# Patient Record
Sex: Male | Born: 1990 | Race: White | Hispanic: No | Marital: Single | Smoking: Current every day smoker
Health system: Southern US, Community
[De-identification: ages and names within clinical notes are randomized; demographics above are authoritative.]

## PROBLEM LIST (undated history)

## (undated) ENCOUNTER — Ambulatory Visit

## (undated) ENCOUNTER — Encounter

## (undated) ENCOUNTER — Emergency Department (HOSPITAL_COMMUNITY): Payer: Self-pay

## (undated) DIAGNOSIS — F1990 Other psychoactive substance use, unspecified, uncomplicated: Secondary | ICD-10-CM

---

## 2012-11-03 ENCOUNTER — Encounter: Payer: Self-pay | Admitting: *Deleted

## 2012-11-03 ENCOUNTER — Emergency Department (INDEPENDENT_AMBULATORY_CARE_PROVIDER_SITE_OTHER): Payer: Self-pay

## 2012-11-03 ENCOUNTER — Emergency Department
Admission: EM | Admit: 2012-11-03 | Discharge: 2012-11-03 | Disposition: A | Discharge status: Home / Self Care | Payer: Self-pay | Source: Home / Self Care | Attending: Emergency Medicine | Admitting: Emergency Medicine

## 2012-11-03 DIAGNOSIS — S2231XA Fracture of one rib, right side, initial encounter for closed fracture: Secondary | ICD-10-CM

## 2012-11-03 DIAGNOSIS — S2239XA Fracture of one rib, unspecified side, initial encounter for closed fracture: Secondary | ICD-10-CM

## 2012-11-03 DIAGNOSIS — IMO0002 Reserved for concepts with insufficient information to code with codable children: Secondary | ICD-10-CM

## 2012-11-03 MED ORDER — HYDROCODONE-ACETAMINOPHEN 5-500 MG PO TABS: ORAL_TABLET | ORAL | Status: AC

## 2012-11-03 MED ORDER — ETODOLAC 500 MG PO TABS: 500.0000 mg | ORAL_TABLET | Freq: Two times a day (BID) | ORAL | Status: AC
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
## 2015-12-24 ENCOUNTER — Emergency Department (HOSPITAL_COMMUNITY): Payer: Self-pay

## 2015-12-24 ENCOUNTER — Emergency Department (HOSPITAL_COMMUNITY)
Admission: EM | Admit: 2015-12-24 | Discharge: 2015-12-25 | Disposition: A | Discharge status: Home / Self Care | Payer: Self-pay | Attending: Emergency Medicine | Admitting: Emergency Medicine

## 2015-12-24 ENCOUNTER — Encounter (HOSPITAL_COMMUNITY): Payer: Self-pay

## 2015-12-24 DIAGNOSIS — S0990XA Unspecified injury of head, initial encounter: Secondary | ICD-10-CM | POA: Insufficient documentation

## 2015-12-24 DIAGNOSIS — Z880 Allergy status to penicillin: Secondary | ICD-10-CM | POA: Insufficient documentation

## 2015-12-24 DIAGNOSIS — Y9389 Activity, other specified: Secondary | ICD-10-CM | POA: Insufficient documentation

## 2015-12-24 DIAGNOSIS — Y998 Other external cause status: Secondary | ICD-10-CM | POA: Insufficient documentation

## 2015-12-24 DIAGNOSIS — S20211A Contusion of right front wall of thorax, initial encounter: Secondary | ICD-10-CM | POA: Insufficient documentation

## 2015-12-24 DIAGNOSIS — Z23 Encounter for immunization: Secondary | ICD-10-CM | POA: Insufficient documentation

## 2015-12-24 DIAGNOSIS — S01511A Laceration without foreign body of lip, initial encounter: Secondary | ICD-10-CM | POA: Insufficient documentation

## 2015-12-24 DIAGNOSIS — S0512XA Contusion of eyeball and orbital tissues, left eye, initial encounter: Secondary | ICD-10-CM

## 2015-12-24 DIAGNOSIS — Y92410 Unspecified street and highway as the place of occurrence of the external cause: Secondary | ICD-10-CM | POA: Insufficient documentation

## 2015-12-24 DIAGNOSIS — F17200 Nicotine dependence, unspecified, uncomplicated: Secondary | ICD-10-CM | POA: Insufficient documentation

## 2015-12-24 HISTORY — DX: Other psychoactive substance use, unspecified, uncomplicated: F19.90

## 2015-12-24 LAB — COMPREHENSIVE METABOLIC PANEL
ALK PHOS: 69 U/L (ref 38–126)
ALT: 18 U/L (ref 17–63)
ANION GAP: 10 (ref 5–15)
AST: 29 U/L (ref 15–41)
Albumin: 5.1 g/dL — ABNORMAL HIGH (ref 3.5–5.0)
BUN: 11 mg/dL (ref 6–20)
CALCIUM: 9.7 mg/dL (ref 8.9–10.3)
CO2: 24 mmol/L (ref 22–32)
CREATININE: 0.94 mg/dL (ref 0.61–1.24)
Chloride: 105 mmol/L (ref 101–111)
Glucose, Bld: 108 mg/dL — ABNORMAL HIGH (ref 65–99)
Potassium: 4.3 mmol/L (ref 3.5–5.1)
SODIUM: 139 mmol/L (ref 135–145)
TOTAL PROTEIN: 7.7 g/dL (ref 6.5–8.1)
Total Bilirubin: 0.9 mg/dL (ref 0.3–1.2)

## 2015-12-24 LAB — CBC WITH DIFFERENTIAL/PLATELET
BASOS ABS: 0 10*3/uL (ref 0.0–0.1)
Basophils Relative: 0 %
EOS ABS: 0.1 10*3/uL (ref 0.0–0.7)
EOS PCT: 0 %
HCT: 47 % (ref 39.0–52.0)
Hemoglobin: 16.6 g/dL (ref 13.0–17.0)
Lymphocytes Relative: 6 %
Lymphs Abs: 1.1 10*3/uL (ref 0.7–4.0)
MCH: 32.7 pg (ref 26.0–34.0)
MCHC: 35.3 g/dL (ref 30.0–36.0)
MCV: 92.5 fL (ref 78.0–100.0)
Monocytes Absolute: 1.2 10*3/uL — ABNORMAL HIGH (ref 0.1–1.0)
Monocytes Relative: 6 %
Neutro Abs: 16.4 10*3/uL — ABNORMAL HIGH (ref 1.7–7.7)
Neutrophils Relative %: 88 %
Platelets: 245 10*3/uL (ref 150–400)
RBC: 5.08 MIL/uL (ref 4.22–5.81)
RDW: 12.4 % (ref 11.5–15.5)
WBC: 18.7 10*3/uL — AB (ref 4.0–10.5)

## 2015-12-24 LAB — ETHANOL

## 2015-12-24 MED ORDER — ONDANSETRON HCL 4 MG/2ML IJ SOLN
4.0000 mg | Freq: Once | INTRAMUSCULAR | Status: AC
  Administered 2015-12-24: 4 mg via INTRAVENOUS
  Filled 2015-12-24: qty 2

## 2015-12-24 MED ORDER — TETANUS-DIPHTH-ACELL PERTUSSIS 5-2.5-18.5 LF-MCG/0.5 IM SUSP
0.5000 mL | Freq: Once | INTRAMUSCULAR | Status: AC
  Administered 2015-12-24: 0.5 mL via INTRAMUSCULAR
  Filled 2015-12-24: qty 0.5

## 2015-12-24 MED ORDER — BUPIVACAINE-EPINEPHRINE (PF) 0.5% -1:200000 IJ SOLN
3.6000 mL | Freq: Once | INTRAMUSCULAR | Status: AC
  Administered 2015-12-24: 3.6 mL
  Filled 2015-12-24: qty 3.6

## 2015-12-24 MED ORDER — LIDOCAINE HCL (PF) 1 % IJ SOLN
30.0000 mL | Freq: Once | INTRAMUSCULAR | Status: AC
  Administered 2015-12-24: 30 mL via INTRADERMAL
  Filled 2015-12-24: qty 30

## 2015-12-24 MED ORDER — MORPHINE SULFATE (PF) 4 MG/ML IV SOLN
4.0000 mg | Freq: Once | INTRAVENOUS | Status: AC
  Administered 2015-12-24: 4 mg via INTRAVENOUS
  Filled 2015-12-24: qty 1
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
## 2015-12-25 MED ORDER — NAPROXEN 500 MG PO TABS: 500.0000 mg | ORAL_TABLET | Freq: Two times a day (BID) | ORAL | Status: AC

## 2015-12-25 NOTE — ED Notes (Signed)
Pt stable, ambulatory, states understanding of discharge instructions 
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
## 2016-01-01 ENCOUNTER — Emergency Department (HOSPITAL_COMMUNITY)
Admission: EM | Admit: 2016-01-01 | Discharge: 2016-01-01 | Disposition: A | Discharge status: Home / Self Care | Payer: Self-pay | Attending: Emergency Medicine | Admitting: Emergency Medicine

## 2016-01-01 ENCOUNTER — Encounter (HOSPITAL_COMMUNITY): Payer: Self-pay | Admitting: Emergency Medicine

## 2016-01-01 DIAGNOSIS — Z791 Long term (current) use of non-steroidal anti-inflammatories (NSAID): Secondary | ICD-10-CM | POA: Insufficient documentation

## 2016-01-01 DIAGNOSIS — F17200 Nicotine dependence, unspecified, uncomplicated: Secondary | ICD-10-CM | POA: Insufficient documentation

## 2016-01-01 DIAGNOSIS — Z4802 Encounter for removal of sutures: Secondary | ICD-10-CM | POA: Insufficient documentation

## 2016-01-01 DIAGNOSIS — Z880 Allergy status to penicillin: Secondary | ICD-10-CM | POA: Insufficient documentation

## 2016-01-01 MED ORDER — IBUPROFEN 400 MG PO TABS
800.0000 mg | ORAL_TABLET | Freq: Once | ORAL | Status: AC
  Administered 2016-01-01: 800 mg via ORAL
  Filled 2016-01-01: qty 2

## 2016-01-01 NOTE — ED Provider Notes (Signed)
CSN: [id number]     Arrival date & time 01/01/16  1308 History   None    Chief Complaint  Patient presents with  . Suture / Staple Removal     (Consider location/radiation/quality/duration/timing/severity/associated sxs/prior Treatment) Patient is a 25 y.o. male presenting with suture removal. The history is provided by the patient.  Suture / Staple Removal This is a new problem.   Estrella DeedsJacob Matthews is a 25 y.o. male who presents to the ED for suture removal from his lower lip. He denies any problems since they were placed 12/24/15.   Past Medical History  Diagnosis Date  . IV drug user    History reviewed. No pertinent past surgical history. History reviewed. No pertinent family history. Social History  Substance Use Topics  . Smoking status: Current Every Day Smoker  . Smokeless tobacco: None  . Alcohol Use: Yes    Review of Systems Negative except as stated in HPI   Allergies  Penicillins  Home Medications   Prior to Admission medications   Medication Sig Start Date End Date Taking? Authorizing Provider  etodolac (LODINE) 500 MG tablet Take 1 tablet (500 mg total) by mouth 2 (two) times daily with a meal. As needed for pain. 11/03/12   Lajean Manesavid Massey, MD  HYDROcodone-acetaminophen (VICODIN) 5-500 MG per tablet Take 1 or 2 every 4-6 hours as needed for severe pain 11/03/12   Lajean Manesavid Massey, MD  naproxen (NAPROSYN) 500 MG tablet Take 1 tablet (500 mg total) by mouth 2 (two) times daily. 12/25/15   Antony MaduraKelly Humes, PA-C   BP 120/62 mmHg  Pulse 63  Temp(Src) 98.1 F (36.7 C) (Oral)  Ht 5\' 9"  (1.753 m)  Wt 79.379 kg  BMI 25.83 kg/m2  SpO2 100% Physical Exam  Constitutional: He is oriented to person, place, and time. He appears well-developed and well-nourished. No distress.  HENT:  Head: Normocephalic.  Sutures in place lower lip outside and inside, healing well without signs of infection.  Eyes: Conjunctivae and EOM are normal.  Neck: Normal range of motion. Neck supple.   Cardiovascular: Normal rate.   Pulmonary/Chest: Effort normal.  Musculoskeletal: Normal range of motion.  Neurological: He is alert and oriented to person, place, and time. No cranial nerve deficit.  Skin: Skin is warm and dry.  Psychiatric: He has a normal mood and affect. His behavior is normal.  Nursing note and vitals reviewed.   ED Course  Procedures Sutures removal by me. Sutures removed from lower lip x 6 sutures, patient tolerated well without any immediate problems.  MDM  25 y.o. male here for suture removal stable for d/c without signs of infection.  Discussed with the patient plan of care and all questioned fully answered. He will return if any problems arise.  Final diagnoses:  Visit for suture removal       Janne NapoleonHope M Neese, NP 01/01/16 1500  Bethann BerkshireJoseph Zammit, MD 01/01/16 1609

## 2016-01-01 NOTE — ED Notes (Signed)
Pt ambulates independently at time of discharge. Follow up information and discharge instructions reviewed with patient. No additional questions at this time.

## 2016-01-01 NOTE — ED Notes (Signed)
Pt here for removal of stitched that were placed here Friday the 6th.

## 2016-01-01 NOTE — Discharge Instructions (Signed)

## 2016-01-01 NOTE — ED Notes (Signed)
See PA assessment 
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
## 2022-10-14 MED ORDER — DUPIXENT 300 MG/2 ML SUBCUTANEOUS SYRINGE
SUBCUTANEOUS | 12 refills | 28 days
Start: 2022-10-14 — End: ?

## 2022-10-14 NOTE — Unmapped (Signed)
[facility] Specialty Medication Onboarding    Specialty Medication: DUPIXENT SYRINGE 300 mg/2 mL Syrg injection (dupilumab)  Prior Authorization: Not Required   Financial Assistance: Yes - copay card approved as secondary   Final Copay/Day Supply: $0 / 28    Insurance Restrictions: Yes - max 1 month supply     Notes to Pharmacist: n/a    The triage team has completed the benefits investigation and has determined that the patient is able to fill this medication at [facility]. Please contact the patient to complete the onboarding or follow up with the prescribing physician as needed.

## 2022-10-16 NOTE — Unmapped (Signed)
[facility]   Patient Onboarding/Medication Counseling    Johnny Valdez is a 31 y.o. male with atopic eczema who I am counseling today on continuation of therapy.  I am speaking to the patient's family member, wife, [name]  .    Was a Nurse, learning disability used for this call? No    Verified patient's date of birth / HIPAA.    Specialty medication(s) to b2e sent: Inflammatory Disorders: Dupixent      Non-specialty medications/supplies to be sent: sharps kit      Medications not needed at this time: na         The patient declined counseling on medication administration, missed dose instructions, goals of therapy, side effects and monitoring parameters, warnings and precautions, drug/food interactions, and storage, handling precautions, and disposal because they have taken the medication previously. The information in the declined sections below are for informational purposes only and was not discussed with patient.       Dupixent (dupilumab)    Medication & Administration     Dosage: Atopic dermatitis: Inject 600mg  under the skin as a loading dose followed by 300mg  every 14 days thereafter (maintenance only)    Administration:     Dupixent Syringe  1. Gather all supplies needed for injection on a clean, flat working surface: medication syringe removed from packaging, alcohol swab, sharps container, etc.  2. Look at the medication label - look for correct medication, correct dose, and check the expiration date  3. Look at the medication - the liquid in the syringe should appear clear and colorless to pale yellow  4. Lay the syringe on a flat surface and allow it to warm up to room temperature for at least 45 minutes  5. Select injection site - you can use the front of your thigh or your belly (but not the area 2 inches around your belly button); if someone else is giving you the injection you can also use your upper arm in the skin covering your triceps muscle  6. Prepare injection site - wash your hands and clean the skin at the injection site with an alcohol swab and let it air dry, do not touch the injection site again before the injection  7. Hold the middle of the body of the syringe and gently pull the needle safety cap straight out. Be careful not to bend the needle. Do not remove until immediately prior to injection  8. Pinch the skin - with your hand not holding the syringe pinch up a fold of skin at the injection site using your forefinger and thumb  9. Insert the needle into the fold of skin at about a 45 degree angle - it's best to use a quick dart-like motion - with the syringe in position, release the pinch of skin and allow the skin to relax  10. Push the plunger down slowly as far as it will go until the syringe is empty, if the plunger is not fully depressed the needle shield will not extend to cover the needle when it is removed  11. Check that the syringe is empty and keep pressing down on the plunger while you pull the needle out at the same angle as inserted; after the needle is removed completely from the skin, release the plunger allowing the needle shield to activate and cover the used needle  12. Dispose of the used syringe immediately in your sharps disposal container  13. If you see any blood at the injection site,  press a cotton ball or gauze on the site and maintain pressure until the bleeding stops, do not rub the injection site    Adherence/Missed dose instructions:  If a dose is missed, administer within 7 days from the missed dose and then resume the original schedule. If the missed dose is not administered within 7 days, you can either wait until the next dose on the original schedule or take your dose now and resume every 14 days from the new injection date. Do not use 2 doses at the same time or extra doses.      Goals of Therapy     -Reduce symptoms of pruritus and dermatitis  -Prevent exacerbations  -Minimize therapeutic risks  -Avoidance of long-term systemic and topical glucocorticoid use  -Maintenance of effective psychosocial functioning    Side Effects & Monitoring Parameters     Injection site reaction (redness, irritation, inflammation localized to the site of administration)  Signs of a common cold - minor sore throat, runny or stuffy nose, etc.  Recurrence of cold sores (herpes simplex)      The following side effects should be reported to the provider:  Signs of a hypersensitivity reaction - rash; hives; itching; red, swollen, blistered, or peeling skin; wheezing; tightness in the chest or throat; difficulty breathing, swallowing, or talking; swelling of the mouth, face, lips, tongue, or throat; etc.  Eye pain or irritation or any visual disturbances  Shortness of breath or worsening of breathing      Contraindications, Warnings, & Precautions     Have your bloodwork checked as you have been told by your prescriber   Birth control pills and other hormone-based birth control may not work as well to prevent pregnancy  Talk with your doctor if you are pregnant, planning to become pregnant, or breastfeeding  Discuss the possible need for holding your dose(s) of Dupixent?? when a planned procedure is scheduled with the prescriber as it may delay healing/recovery timeline       Drug/Food Interactions     Medication list reviewed in Epic. The patient was instructed to inform the care team before taking any new medications or supplements. No drug interactions identified.   Talk with you prescriber or pharmacist before receiving any live vaccinations while taking this medication and after you stop taking it    Storage, Handling Precautions, & Disposal     Store this medication in the refrigerator.  Do not freeze  If needed, you may store at room temperature for up to 14 days  Store in original packaging, protected from light  Do not shake  Dispose of used syringes in a sharps disposal container            Current Medications (including OTC/herbals), Comorbidities and Allergies Current Outpatient Medications   Medication Sig Dispense Refill    dupilumab (DUPIXENT SYRINGE) 300 mg/2 mL Syrg injection Inject the contents of 1 syringe (300 mg total) under the skin every fourteen (14) days. 4 mL 12    empty container Misc Use as directed to dispose of Dupixent syringes. 1 each 2     No current facility-administered medications for this visit.       Not on File    There is no problem list on file for this patient.      Reviewed and up to date in Epic.    Appropriateness of Therapy     Acute infections noted within Epic:  No active infections  Patient reported infection: None  Is medication and dose appropriate based on diagnosis and infection status? Yes    Prescription has been clinically reviewed: Yes      Baseline Quality of Life Assessment      How many days over the past month did your AD  keep you from your normal activities? For example, brushing your teeth or getting up in the morning. 0    Financial Information     Medication Assistance provided: None Required    Anticipated copay of $0 reviewed with patient. Verified delivery address.    Delivery Information     Scheduled delivery date: 11/8    Expected start date: 11/9 (overdue, was due 11/2, but will plan for 11/9 and 11/23 for injections)    Medication will be delivered via UPS to the prescription address in [facility].  This shipment will not require a signature.      Explained the services we provide at [facility] and that each month we would call to set up refills.  Stressed importance of returning phone calls so that we could ensure they receive their medications in time each month.  Informed patient that we should be setting up refills 7-10 days prior to when they will run out of medication.  A pharmacist will reach out to perform a clinical assessment periodically.  Informed patient that a welcome packet, containing information about our pharmacy and other support services, a Notice of Privacy Practices, and a drug information handout will be sent.      The patient or caregiver noted above participated in the development of this care plan and knows that they can request review of or adjustments to the care plan at any time.      Patient or caregiver verbalized understanding of the above information as well as how to contact the pharmacy at [phone] option 4 with any questions/concerns.  The pharmacy is open Monday through Friday 8:30am-4:30pm.  A pharmacist is available 24/7 via pager to answer any clinical questions they may have.    Patient Specific Needs     Does the patient have any physical, cognitive, or cultural barriers? No    Does the patient have adequate living arrangements? (i.e. the ability to store and take their medication appropriately) Yes    Did you identify any home environmental safety or security hazards? No    Patient prefers to have medications discussed with  Family Member     Is the patient or caregiver able to read and understand education materials at a high school level or above? Yes    Patient's primary language is  English     Is the patient high risk? No    SOCIAL DETERMINANTS OF HEALTH     At the [facility], we have learned that life circumstances - like trouble affording food, housing, utilities, or transportation can affect the health of many of our patients.   That is why we wanted to ask: are you currently experiencing any life circumstances that are negatively impacting your health and/or quality of life? Patient declined to answer    Social Determinants of Health     Financial Resource Strain: Not on file   Internet Connectivity: Not on file   Food Insecurity: Not on file   Tobacco Use: Not on file   Housing/Utilities: Not on file   Alcohol Use: Not on file   Transportation Needs: Not on file   Substance Use: Not on file   Health Literacy: Not  on file   Physical Activity: Not on file   Interpersonal Safety: Not on file   Stress: Not on file   Intimate Partner Violence: Not on file   Depression: Not on file   Social Connections: Not on file       Would you be willing to receive help with any of the needs that you have identified today? Not applicable       Greenley Martone A Desiree Lucy [facility] Specialty Pharmacist

## 2022-10-23 MED ORDER — EMPTY CONTAINER
2 refills | 0 days
Start: 2022-10-23 — End: ?

## 2022-10-24 MED FILL — EMPTY CONTAINER: 120 days supply | Qty: 1 | Fill #0

## 2022-10-24 MED FILL — DUPIXENT 300 MG/2 ML SUBCUTANEOUS SYRINGE: SUBCUTANEOUS | 28 days supply | Qty: 4 | Fill #0

## 2022-11-07 NOTE — Unmapped (Deleted)
[facility] Clinical Assessment & Refill Coordination Note    Johnny Valdez, DOB: [birth year]  Phone: [phone] (home) [phone] (work)    All above HIPAA information was verified with {Blank:19197::patient.,patient's caregiver, ***.,patient's family member, ***.}     Was a Nurse, learning disability used for this call? {Blank single:19197::Yes, ***. Patient language is appropriate in WAM,No}    Specialty Medication(s):   {specpharm:59087}     Current Outpatient Medications   Medication Sig Dispense Refill    dupilumab (DUPIXENT SYRINGE) 300 mg/2 mL Syrg injection Inject the contents of 1 syringe (300 mg total) under the skin every fourteen (14) days. 4 mL 12    empty container Misc Use as directed to dispose of Dupixent syringes. 1 each 2     No current facility-administered medications for this visit.        Changes to medications: {Blank:19197::[name] reports starting the following medications: ***,[name] Reports stopping the following medications: ***,[name] reports no changes at this time.}    Not on File    Changes to allergies: {Blank:19197::Yes: ***,No}    SPECIALTY MEDICATION ADHERENCE     *** *** {Blank:19197::mg,mg/ml,***}: *** days of medicine on hand   *** *** {Blank:19197::mg,mg/ml,***}: *** days of medicine on hand   *** *** {Blank:19197::mg,mg/ml,***}: *** days of medicine on hand   *** *** {Blank:19197::mg,mg/ml,***}: *** days of medicine on hand   *** *** {Blank:19197::mg,mg/ml,***}: *** days of medicine on hand          Specialty medication(s) dose(s) confirmed: {Blank:19197::Regimen is correct and unchanged.,Patient reports changes to the regimen as follows: ***}     Are there any concerns with adherence? {Blank:19197::Yes: ***,No}    Adherence counseling provided? {Blank:19197::Yes: ***,Not needed}    CLINICAL MANAGEMENT AND INTERVENTION      Clinical Benefit Assessment:    Do you feel the medicine is effective or helping your condition? {Blank:19197::Yes,No,Patient declined to answer}    Clinical Benefit counseling provided? {Blank:19197::Not needed,Reasonable expectations discussed: ***,Labs from *** show evidence of clinical benefit,Progress note from *** shows evidence of clinical benefit,consulted provider regarding clinical benefit concerns,***}    Adverse Effects Assessment:    Are you experiencing any side effects? {Blank:19197::Yes, patient reports experiencing ***. Side effect counseling provided: ***,No}    Are you experiencing difficulty administering your medicine? {Blank:19197::Yes, patient reports ***. Medication administration counseling provided: ***,No}    Quality of Life Assessment:    Quality of Life    Rheumatology  Oncology  Dermatology  Cystic Fibrosis          {DiseaseSpecificQOL:73897}    Have you discussed this with your provider? {Blank:19197::Not needed,Yes,No - pharmacist will consult provider}    Acute Infection Status:    Acute infections noted within Epic:  No active infections  Patient reported infection: {Blank single:19197::None,***- patient reported to provider,***- pharmacy reported to provider}    Therapy Appropriateness:    Is therapy appropriate and patient progressing towards therapeutic goals? {Blank:19197::Yes, therapy is appropriate and should be continued,Pharmacist will consult provider}    DISEASE/MEDICATION-SPECIFIC INFORMATION      {clinicspecificinstructions:59274}    {DISEASESTATESPECIFICASSESSMENT:98894}    PATIENT SPECIFIC NEEDS     Does the patient have any physical, cognitive, or cultural barriers? {Blank single:19197::No,Yes - ***}    Is the patient high risk? {sschighriskpts:78327}    Did the patient require a clinical intervention? {Blank single:19197::No,Yes (If yes, document using the sscrphintervention smartphrase)}    Does the patient require physician intervention or other additional services (i.e., nutrition, smoking cessation, social work)? {Blank  single:19197::No,Yes, ***}    SOCIAL DETERMINANTS OF HEALTH     At the [facility], we have learned that life circumstances - like trouble affording food, housing, utilities, or transportation can affect the health of many of our patients.   That is why we wanted to ask: are you currently experiencing any life circumstances that are negatively impacting your health and/or quality of life? {YES/NO/PATIENTDECLINED:93004}    Social Determinants of Health     Financial Resource Strain: Not on file   Internet Connectivity: Not on file   Food Insecurity: Not on file   Tobacco Use: Not on file   Housing/Utilities: Not on file   Alcohol Use: Not on file   Transportation Needs: Not on file   Substance Use: Not on file   Health Literacy: Not on file   Physical Activity: Not on file   Interpersonal Safety: Not on file   Stress: Not on file   Intimate Partner Violence: Not on file   Depression: Not on file   Social Connections: Not on file       Would you be willing to receive help with any of the needs that you have identified today? {Yes/No/Not applicable:93005}       SHIPPING     Specialty Medication(s) to be Shipped:   {specpharm:59087}    Other medication(s) to be shipped: {Blank:19197::***,No additional medications requested for fill at this time}     Changes to insurance: {Blank:19197::Yes: ***,No}    Delivery Scheduled: {Blank:19197::Yes, Expected medication delivery date: ***.,Yes, Expected medication delivery date: ***.  However, Rx request for refills was sent to the provider as there are none remaining.,Patient declined refill at this time due to ***.,No, cannot schedule delivery at this time as there are outstanding items that need addressed.  This note has been handed off to the provider for follow up.,Due to patient insurance changes, unable to fill at [facility], please route Rx to *** specialty pharmacy}     Medication will be delivered via {Blank:19197::UPS,Next Day Courier,Same Day Courier,Clinic Courier - *** clinic,***} to the confirmed {Blank:19197::prescription,temporary} address in [facility].    The patient will receive a drug information handout for each medication shipped and additional FDA Medication Guides as required.  Verified that patient has previously received a Conservation officer, historic buildings and a Surveyor, mining.    The patient or caregiver noted above participated in the development of this care plan and knows that they can request review of or adjustments to the care plan at any time.      All of the patient's questions and concerns have been addressed.    Sherral Hammers, PharmD   [facility] Specialty Pharmacist

## 2022-11-07 NOTE — Unmapped (Signed)
[facility] Clinical Assessment & Refill Coordination Note    Johnny Valdez, DOB: 21-Nov-1991  Phone: [phone] (home) [phone] (work)    All above HIPAA information was verified with patient's family member, Wife Publishing rights manager.     Was a Nurse, learning disability used for this call? No    Specialty Medication(s):   Inflammatory Disorders: Dupixent     Current Outpatient Medications   Medication Sig Dispense Refill    dupilumab (DUPIXENT SYRINGE) 300 mg/2 mL Syrg injection Inject the contents of 1 syringe (300 mg total) under the skin every fourteen (14) days. 4 mL 12    empty container Misc Use as directed to dispose of Dupixent syringes. 1 each 2     No current facility-administered medications for this visit.        Changes to medications: Reon reports no changes at this time.    Not on File    Changes to allergies: No    SPECIALTY MEDICATION ADHERENCE     Dupixent syringe 300 mg/ml: 13 days of medicine on hand        Specialty medication(s) dose(s) confirmed: Regimen is correct and unchanged.     Are there any concerns with adherence? No    Adherence counseling provided? Not needed    CLINICAL MANAGEMENT AND INTERVENTION      Clinical Benefit Assessment:    Do you feel the medicine is effective or helping your condition? Yes    Clinical Benefit counseling provided? Not needed    Adverse Effects Assessment:    Are you experiencing any side effects? No    Are you experiencing difficulty administering your medicine? No    Quality of Life Assessment:    Quality of Life    Rheumatology  Oncology  Dermatology  Cystic Fibrosis          How many days over the past month did your Atopic eczema  keep you from your normal activities? For example, brushing your teeth or getting up in the morning. 0    Have you discussed this with your provider? Not needed    Acute Infection Status:    Acute infections noted within Epic:  No active infections  Patient reported infection: None    Therapy Appropriateness:    Is therapy appropriate and patient progressing towards therapeutic goals? Yes, therapy is appropriate and should be continued    DISEASE/MEDICATION-SPECIFIC INFORMATION      For patients on injectable medications: Patient currently has 1 doses left.  Next injection is scheduled for 11/09/22.    Chronic Inflammatory Diseases: Have you experienced any flares in the last month? No    PATIENT SPECIFIC NEEDS     Does the patient have any physical, cognitive, or cultural barriers? No    Is the patient high risk? No    Did the patient require a clinical intervention? No    Does the patient require physician intervention or other additional services (i.e., nutrition, smoking cessation, social work)? No    SOCIAL DETERMINANTS OF HEALTH     At the [facility], we have learned that life circumstances - like trouble affording food, housing, utilities, or transportation can affect the health of many of our patients.   That is why we wanted to ask: are you currently experiencing any life circumstances that are negatively impacting your health and/or quality of life? Patient declined to answer    Social Determinants of Health     Financial Resource Strain: Not on file  Internet Connectivity: Not on file   Food Insecurity: Not on file   Tobacco Use: Not on file   Housing/Utilities: Not on file   Alcohol Use: Not on file   Transportation Needs: Not on file   Substance Use: Not on file   Health Literacy: Not on file   Physical Activity: Not on file   Interpersonal Safety: Not on file   Stress: Not on file   Intimate Partner Violence: Not on file   Depression: Not on file   Social Connections: Not on file       Would you be willing to receive help with any of the needs that you have identified today? Not applicable       SHIPPING     Specialty Medication(s) to be Shipped:   Inflammatory Disorders: Dupixent    Other medication(s) to be shipped: No additional medications requested for fill at this time Changes to insurance: No    Delivery Scheduled: Yes, Expected medication delivery date: 11/15/22.     Medication will be delivered via UPS to the confirmed prescription address in [facility].    The patient will receive a drug information handout for each medication shipped and additional FDA Medication Guides as required.  Verified that patient has previously received a Conservation officer, historic buildings and a Surveyor, mining.    The patient or caregiver noted above participated in the development of this care plan and knows that they can request review of or adjustments to the care plan at any time.      All of the patient's questions and concerns have been addressed.    Sherral Hammers, PharmD   [facility] Specialty Pharmacist

## 2022-11-14 MED FILL — DUPIXENT 300 MG/2 ML SUBCUTANEOUS SYRINGE: SUBCUTANEOUS | 28 days supply | Qty: 4 | Fill #1

## 2022-11-28 NOTE — Unmapped (Signed)
[facility] Refill Coordination Note    Specialty Medication(s) to be Shipped:   Inflammatory Disorders: Dupixent    Other medication(s) to be shipped: No additional medications requested for fill at this time     Johnny Valdez, DOB: Aug 12, 1991  Phone: [phone] (home) [phone] (work)      All above HIPAA information was verified with patient's family member, wife.     Was a Nurse, learning disability used for this call? No    Completed refill call assessment today to schedule patient's medication shipment from the [facility] [phone]).  All relevant notes have been reviewed.     Specialty medication(s) and dose(s) confirmed: Regimen is correct and unchanged.   Changes to medications: Johnny Valdez reports no changes at this time.  Changes to insurance: No  New side effects reported not previously addressed with a pharmacist or physician: None reported  Questions for the pharmacist: No    Confirmed patient received a Conservation officer, historic buildings and a Surveyor, mining with first shipment. The patient will receive a drug information handout for each medication shipped and additional FDA Medication Guides as required.       DISEASE/MEDICATION-SPECIFIC INFORMATION        For patients on injectable medications: Patient currently has 1 doses left.  Next injection is scheduled for 12/07/22.    SPECIALTY MEDICATION ADHERENCE     Medication Adherence    Patient reported X missed doses in the last month: 0  Specialty Medication: Dupixent  Patient is on additional specialty medications: No                                Were doses missed due to medication being on hold? No    Dupixent 300/2 mg/ml: 9 days of medicine on hand        REFERRAL TO PHARMACIST     Referral to the pharmacist: Not needed      [facility]     Shipping address confirmed in Epic.     Delivery Scheduled: Yes, Expected medication delivery date: 12/13/22.     Medication will be delivered via UPS to the prescription address in Epic WAM.    Unk Lightning   [facility] Specialty Technician

## 2022-12-12 MED FILL — DUPIXENT 300 MG/2 ML SUBCUTANEOUS SYRINGE: SUBCUTANEOUS | 28 days supply | Qty: 4 | Fill #2

## 2023-01-04 NOTE — Unmapped (Signed)
[facility] Refill Coordination Note    Specialty Medication(s) to be Shipped:   Inflammatory Disorders: Dupixent    Other medication(s) to be shipped: No additional medications requested for fill at this time     Johnny Valdez, DOB: 02-21-1991  Phone: [phone] (home) [phone] (work)      All above HIPAA information was verified with patient's family member, wife.     Was a Nurse, learning disability used for this call? No    Completed refill call assessment today to schedule patient's medication shipment from the [facility] [phone]).  All relevant notes have been reviewed.     Specialty medication(s) and dose(s) confirmed: Regimen is correct and unchanged.   Changes to medications: [name] reports no changes at this time.  Changes to insurance: No  New side effects reported not previously addressed with a pharmacist or physician: None reported  Questions for the pharmacist: No    Confirmed patient received a Conservation officer, historic buildings and a Surveyor, mining with first shipment. The patient will receive a drug information handout for each medication shipped and additional FDA Medication Guides as required.       DISEASE/MEDICATION-SPECIFIC INFORMATION        For patients on injectable medications: Patient currently has 1 doses left.  Next injection is scheduled for 01/04/23.    SPECIALTY MEDICATION ADHERENCE              Were doses missed due to medication being on hold? No    Dupixent 300 mg/ml: 14 days of medicine on hand       REFERRAL TO PHARMACIST     Referral to the pharmacist: Not needed      [facility]     Shipping address confirmed in Epic.     Delivery Scheduled: Yes, Expected medication delivery date: 01/10/23.     Medication will be delivered via UPS to the prescription address in Epic WAM.    Sherral Hammers, PharmD   [facility] Specialty Pharmacist

## 2023-01-09 MED FILL — DUPIXENT 300 MG/2 ML SUBCUTANEOUS SYRINGE: SUBCUTANEOUS | 28 days supply | Qty: 4 | Fill #3

## 2023-01-26 NOTE — Unmapped (Signed)
[facility] Refill Coordination Note    Specialty Medication(s) to be Shipped:   Inflammatory Disorders: Dupixent    Other medication(s) to be shipped: No additional medications requested for fill at this time     Johnny Valdez, DOB: 1991/01/24  Phone: [phone] (home) [phone] (work)      All above HIPAA information was verified with patient's family member, Publishing rights manager.     Was a Nurse, learning disability used for this call? No    Completed refill call assessment today to schedule patient's medication shipment from the [facility] [phone]).  All relevant notes have been reviewed.     Specialty medication(s) and dose(s) confirmed: Regimen is correct and unchanged.   Changes to medications: [name] reports no changes at this time.  Changes to insurance: No  New side effects reported not previously addressed with a pharmacist or physician: None reported  Questions for the pharmacist: No    Confirmed patient received a Conservation officer, historic buildings and a Surveyor, mining with first shipment. The patient will receive a drug information handout for each medication shipped and additional FDA Medication Guides as required.       DISEASE/MEDICATION-SPECIFIC INFORMATION        For patients on injectable medications: Patient currently has 1 doses left.  Next injection is scheduled for 02/01/23.    SPECIALTY MEDICATION ADHERENCE              Were doses missed due to medication being on hold? No    Dupixent 300 mg/ml: 14 days of medicine on hand       REFERRAL TO PHARMACIST     Referral to the pharmacist: Not needed      [facility]     Shipping address confirmed in Epic.     Delivery Scheduled: Yes, Expected medication delivery date: 02/07/23.     Medication will be delivered via UPS to the prescription address in Epic WAM.    Sherral Hammers, PharmD   [facility] Specialty Pharmacist

## 2023-02-06 MED FILL — DUPIXENT 300 MG/2 ML SUBCUTANEOUS SYRINGE: SUBCUTANEOUS | 28 days supply | Qty: 4 | Fill #4

## 2023-02-28 NOTE — Unmapped (Signed)
[facility] Refill Coordination Note    Specialty Medication(s) to be Shipped:   Inflammatory Disorders: Dupixent    Other medication(s) to be shipped: No additional medications requested for fill at this time     Johnny Valdez, DOB: 01-23-1991  Phone: [phone] (home) [phone] (work)      All above HIPAA information was verified with patient's family member, wife Johnny Valdez.     Was a Nurse, learning disability used for this call? No    Completed refill call assessment today to schedule patient's medication shipment from the [facility] [phone]).  All relevant notes have been reviewed.     Specialty medication(s) and dose(s) confirmed: Regimen is correct and unchanged.   Changes to medications: Johnny Valdez reports no changes at this time.  Changes to insurance: No  New side effects reported not previously addressed with a pharmacist or physician: None reported  Questions for the pharmacist: No    Confirmed patient received a Conservation officer, historic buildings and a Surveyor, mining with first shipment. The patient will receive a drug information handout for each medication shipped and additional FDA Medication Guides as required.       DISEASE/MEDICATION-SPECIFIC INFORMATION        For patients on injectable medications: Patient currently has 1 doses left.  Next injection is scheduled for 03/01/23.    SPECIALTY MEDICATION ADHERENCE              Were doses missed due to medication being on hold? No    Dupixent 300 mg/ml: 14 days of medicine on hand     REFERRAL TO PHARMACIST     Referral to the pharmacist: Not needed      [facility]     Shipping address confirmed in Epic.     Patient was notified of new phone menu : No    Delivery Scheduled: Yes, Expected medication delivery date: 03/07/23.     Medication will be delivered via UPS to the prescription address in Epic WAM.    Johnny Valdez, PharmD   [facility] Specialty Pharmacist

## 2023-03-06 MED FILL — DUPIXENT 300 MG/2 ML SUBCUTANEOUS SYRINGE: SUBCUTANEOUS | 28 days supply | Qty: 4 | Fill #5

## 2023-03-29 NOTE — Unmapped (Signed)
[facility] Refill Coordination Note    Specialty Medication(s) to be Shipped:   Inflammatory Disorders: Dupixent    Other medication(s) to be shipped: No additional medications requested for fill at this time     Johnny Valdez, DOB: Mar 02, 1991  Phone: [phone] (home) [phone] (work)      All above HIPAA information was verified with patient.     Was a Nurse, learning disability used for this call? No    Completed refill call assessment today to schedule patient's medication shipment from the [facility] [phone]).  All relevant notes have been reviewed.     Specialty medication(s) and dose(s) confirmed: Regimen is correct and unchanged.   Changes to medications: Kaesyn reports no changes at this time.  Changes to insurance: No  New side effects reported not previously addressed with a pharmacist or physician: None reported  Questions for the pharmacist: No    Confirmed patient received a Conservation officer, historic buildings and a Surveyor, mining with first shipment. The patient will receive a drug information handout for each medication shipped and additional FDA Medication Guides as required.       DISEASE/MEDICATION-SPECIFIC INFORMATION        For patients on injectable medications: Patient currently has 1 doses left.  Next injection is scheduled for 03/29/23.    SPECIALTY MEDICATION ADHERENCE     Medication Adherence    Patient reported X missed doses in the last month: 0  Specialty Medication: DUPIXENT SYRINGE 300 mg/2 mL Syrg injection (dupilumab)  Patient is on additional specialty medications: No  Patient is on more than two specialty medications: No  Any gaps in refill history greater than 2 weeks in the last 3 months: no  Demonstrates understanding of importance of adherence: yes              Were doses missed due to medication being on hold? No    DUPIXENT SYRINGE 300   mg/ml: 14 days of medicine on hand       REFERRAL TO PHARMACIST     Referral to the pharmacist: Not needed      [facility]     Shipping address confirmed in Epic.       Delivery Scheduled: Yes, Expected medication delivery date: 04/05/23.     Medication will be delivered via UPS to the prescription address in Epic WAM.    Moshe Salisbury   [facility] Specialty Technician

## 2023-04-04 MED FILL — DUPIXENT 300 MG/2 ML SUBCUTANEOUS SYRINGE: SUBCUTANEOUS | 28 days supply | Qty: 4 | Fill #6

## 2023-04-25 NOTE — Unmapped (Signed)
[facility] Refill Coordination Note    Specialty Medication(s) to be Shipped:   Inflammatory Disorders: Dupixent    Other medication(s) to be shipped: No additional medications requested for fill at this time     Johnny Valdez, DOB: [birth year]  Phone: [phone] (home) [phone] (work)      All above HIPAA information was verified with patient's family member, Wife.     Was a Nurse, learning disability used for this call? No    Completed refill call assessment today to schedule patient's medication shipment from the [facility] [phone]).  All relevant notes have been reviewed.     Specialty medication(s) and dose(s) confirmed: Regimen is correct and unchanged.   Changes to medications: Shenouda reports no changes at this time.  Changes to insurance: No  New side effects reported not previously addressed with a pharmacist or physician: None reported  Questions for the pharmacist: No    Confirmed patient received a Conservation officer, historic buildings and a Surveyor, mining with first shipment. The patient will receive a drug information handout for each medication shipped and additional FDA Medication Guides as required.       DISEASE/MEDICATION-SPECIFIC INFORMATION        For patients on injectable medications: Patient currently has 1 doses left.  Next injection is scheduled for 5/9.    SPECIALTY MEDICATION ADHERENCE     Medication Adherence    Patient reported X missed doses in the last month: 0  Specialty Medication: DUPIXENT SYRINGE 300 mg/2 mL Syrg injection (dupilumab)  Patient is on additional specialty medications: No              Were doses missed due to medication being on hold? No    Dupxient 300/2 mg/ml: 1 days of medicine on hand        REFERRAL TO PHARMACIST     Referral to the pharmacist: Not needed      [facility]     Shipping address confirmed in Epic.       Delivery Scheduled: Yes, Expected medication delivery date: 05/02/23.     Medication will be delivered via UPS to the prescription address in Epic WAM.    Willette Pa   [facility] Specialty Technician

## 2023-05-01 MED FILL — DUPIXENT 300 MG/2 ML SUBCUTANEOUS SYRINGE: SUBCUTANEOUS | 28 days supply | Qty: 4 | Fill #7

## 2023-05-21 NOTE — Unmapped (Signed)
[facility] Clinical Assessment & Refill Coordination Note    Johnny Valdez, DOB: 06-23-1991  Phone: [phone] (home) [phone] (work)    All above HIPAA information was verified with patient's family member, Wife.     Was a Nurse, learning disability used for this call? No    Specialty Medication(s):   Inflammatory Disorders: Dupixent     Current Outpatient Medications   Medication Sig Dispense Refill    dupilumab (DUPIXENT SYRINGE) 300 mg/2 mL Syrg injection Inject the contents of 1 syringe (300 mg total) under the skin every fourteen (14) days. 4 mL 12    empty container Misc Use as directed to dispose of Dupixent syringes. 1 each 2     No current facility-administered medications for this visit.        Changes to medications: Edis reports no changes at this time.    Allergies   Allergen Reactions    Penicillins        Changes to allergies: No    SPECIALTY MEDICATION ADHERENCE     Dupixent 300 mg/ml: 17 days of medicine on hand    Specialty medication(s) dose(s) confirmed: Regimen is correct and unchanged.     Are there any concerns with adherence? No    Adherence counseling provided? Not needed    CLINICAL MANAGEMENT AND INTERVENTION      Clinical Benefit Assessment:    Do you feel the medicine is effective or helping your condition? Yes    Clinical Benefit counseling provided? Not needed    Adverse Effects Assessment:    Are you experiencing any side effects? No    Are you experiencing difficulty administering your medicine? No    Quality of Life Assessment:    Quality of Life    Rheumatology  Oncology  Dermatology  1. What impact has your specialty medication had on the symptoms of your skin condition (i.e. itchiness, soreness, stinging)?: Tremendous  2. What impact has your specialty medication had on your comfort level with your skin?: Tremendous  Cystic Fibrosis          How many days over the past month did your atopic eczema  keep you from your normal activities? For example, brushing your teeth or getting up in the morning. 0    Have you discussed this with your provider? Not needed    Acute Infection Status:    Acute infections noted within Epic:  No active infections  Patient reported infection: None    Therapy Appropriateness:    Is therapy appropriate and patient progressing towards therapeutic goals? Yes, therapy is appropriate and should be continued    DISEASE/MEDICATION-SPECIFIC INFORMATION      For patients on injectable medications: Patient currently has 1 doses left.  Next injection is scheduled for 05/24/23.    Chronic Inflammatory Diseases: Have you experienced any flares in the last month? No    PATIENT SPECIFIC NEEDS     Does the patient have any physical, cognitive, or cultural barriers? No    Is the patient high risk? No    Did the patient require a clinical intervention? No    Does the patient require physician intervention or other additional services (i.e., nutrition, smoking cessation, social work)? No    SOCIAL DETERMINANTS OF HEALTH     At the [facility], we have learned that life circumstances - like trouble affording food, housing, utilities, or transportation can affect the health of many of our patients.   That is why we  wanted to ask: are you currently experiencing any life circumstances that are negatively impacting your health and/or quality of life? Patient declined to answer    Social Determinants of Health     Financial Resource Strain: Not on file   Internet Connectivity: Not on file   Food Insecurity: Not on file   Tobacco Use: Not on file   Housing/Utilities: Not on file   Alcohol Use: Not on file   Transportation Needs: Not on file   Substance Use: Not on file   Health Literacy: Not on file   Physical Activity: Not on file   Interpersonal Safety: Not on file   Stress: Not on file   Intimate Partner Violence: Not on file   Depression: Not on file   Social Connections: Not on file       Would you be willing to receive help with any of the needs that you have identified today? Not applicable       SHIPPING     Specialty Medication(s) to be Shipped:   Inflammatory Disorders: Dupixent    Other medication(s) to be shipped: No additional medications requested for fill at this time     Changes to insurance: No    Delivery Scheduled: Yes, Expected medication delivery date: 05/29/23.     Medication will be delivered via UPS to the confirmed prescription address in [facility].    The patient will receive a drug information handout for each medication shipped and additional FDA Medication Guides as required.  Verified that patient has previously received a Conservation officer, historic buildings and a Surveyor, mining.    The patient or caregiver noted above participated in the development of this care plan and knows that they can request review of or adjustments to the care plan at any time.      All of the patient's questions and concerns have been addressed.    Sherral Hammers, PharmD   [facility] Specialty Pharmacist

## 2023-05-28 MED FILL — DUPIXENT 300 MG/2 ML SUBCUTANEOUS SYRINGE: SUBCUTANEOUS | 28 days supply | Qty: 4 | Fill #8

## 2023-06-19 NOTE — Unmapped (Signed)
[facility] Refill Coordination Note    Specialty Medication(s) to be Shipped:   Inflammatory Disorders: Dupixent    Other medication(s) to be shipped: No additional medications requested for fill at this time     Johnny Valdez, DOB: 1991-11-27  Phone: [phone] (home) [phone] (work)      All above HIPAA information was verified with patient.     Was a Nurse, learning disability used for this call? No    Completed refill call assessment today to schedule patient's medication shipment from the [facility] [phone]).  All relevant notes have been reviewed.     Specialty medication(s) and dose(s) confirmed: Regimen is correct and unchanged.   Changes to medications: [name] reports no changes at this time.  Changes to insurance: No  New side effects reported not previously addressed with a pharmacist or physician: None reported  Questions for the pharmacist: No    Confirmed patient received a Conservation officer, historic buildings and a Surveyor, mining with first shipment. The patient will receive a drug information handout for each medication shipped and additional FDA Medication Guides as required.       DISEASE/MEDICATION-SPECIFIC INFORMATION        For patients on injectable medications: Patient currently has 1 doses left.  Next injection is scheduled for 06/21/23.    SPECIALTY MEDICATION ADHERENCE     Medication Adherence    Patient reported X missed doses in the last month: 0  Specialty Medication: DUPIXENT SYRINGE 300 mg/2 mL Syrg injection (dupilumab)  Patient is on additional specialty medications: No  Patient is on more than two specialty medications: No  Any gaps in refill history greater than 2 weeks in the last 3 months: no  Demonstrates understanding of importance of adherence: yes              Were doses missed due to medication being on hold? No    DUPIXENT SYRINGE 300   mg/ml: 14 days of medicine on hand       REFERRAL TO PHARMACIST     Referral to the pharmacist: Not needed      [facility]     Shipping address confirmed in Epic.       Delivery Scheduled: Yes, Expected medication delivery date: 06/28/23.     Medication will be delivered via UPS to the prescription address in Epic WAM.    Moshe Salisbury   [facility] Specialty Technician

## 2023-06-27 MED FILL — DUPIXENT 300 MG/2 ML SUBCUTANEOUS SYRINGE: SUBCUTANEOUS | 28 days supply | Qty: 4 | Fill #9

## 2023-07-19 NOTE — Unmapped (Signed)
[facility] Refill Coordination Note    Specialty Medication(s) to be Shipped:   Inflammatory Disorders: Dupixent    Other medication(s) to be shipped: No additional medications requested for fill at this time     Johnny Valdez, DOB: Apr 28, 1991  Phone: [phone] (home) [phone] (work)      All above HIPAA information was verified with patient's family member, wife.     Was a Nurse, learning disability used for this call? No    Completed refill call assessment today to schedule patient's medication shipment from the [facility] [phone]).  All relevant notes have been reviewed.     Specialty medication(s) and dose(s) confirmed: Regimen is correct and unchanged.   Changes to medications: Royce reports no changes at this time.  Changes to insurance: No  New side effects reported not previously addressed with a pharmacist or physician: None reported  Questions for the pharmacist: No    Confirmed patient received a Conservation officer, historic buildings and a Surveyor, mining with first shipment. The patient will receive a drug information handout for each medication shipped and additional FDA Medication Guides as required.       DISEASE/MEDICATION-SPECIFIC INFORMATION        For patients on injectable medications: Patient currently has 1 doses left.  Next injection is scheduled for 08/01.    SPECIALTY MEDICATION ADHERENCE     Medication Adherence    Patient reported X missed doses in the last month: 0  Specialty Medication: DUPIXENT SYRINGE 300 mg/2 mL Syrg injection (dupilumab)  Patient is on additional specialty medications: No  Patient is on more than two specialty medications: No  Any gaps in refill history greater than 2 weeks in the last 3 months: no  Demonstrates understanding of importance of adherence: yes  Informant: spouse  Reliability of informant: reliable  Provider-estimated medication adherence level: good  Patient is at risk for Non-Adherence: No  Reasons for non-adherence: no problems identified  Confirmed plan for next specialty medication refill: delivery by pharmacy  Refills needed for supportive medications: not needed          Refill Coordination    Has the Patients' Contact Information Changed: No  Is the Shipping Address Different: No         Were doses missed due to medication being on hold? No    Dupixent  300/2 mg/ml: 14 days of medicine on hand       REFERRAL TO PHARMACIST     Referral to the pharmacist: Not needed      [facility]     Shipping address confirmed in Epic.       Delivery Scheduled: Yes, Expected medication delivery date: 08/07.     Medication will be delivered via UPS to the prescription address in Epic WAM.    Antonietta Barcelona   [facility] Specialty Technician

## 2023-07-24 MED FILL — DUPIXENT 300 MG/2 ML SUBCUTANEOUS SYRINGE: SUBCUTANEOUS | 28 days supply | Qty: 4 | Fill #10

## 2023-08-14 MED ORDER — DUPIXENT 300 MG/2 ML SUBCUTANEOUS SYRINGE
SUBCUTANEOUS | 12 refills | 28.00000 days | Status: CN
Start: 2023-08-14 — End: ?

## 2023-08-14 NOTE — Unmapped (Signed)
[facility] Refill Coordination Note    Specialty Medication(s) to be Shipped:   Inflammatory Disorders: Dupixent    Other medication(s) to be shipped: No additional medications requested for fill at this time     Johnny Valdez, DOB: [birth year]  Phone: [phone] (home) [phone] (work)      All above HIPAA information was verified with patient's family member, wife.     Was a Nurse, learning disability used for this call? No    Completed refill call assessment today to schedule patient's medication shipment from the [facility] [phone]).  All relevant notes have been reviewed.     Specialty medication(s) and dose(s) confirmed: Regimen is correct and unchanged.   Changes to medications: [name] reports no changes at this time.  Changes to insurance: No  New side effects reported not previously addressed with a pharmacist or physician: None reported  Questions for the pharmacist: No    Confirmed patient received a Conservation officer, historic buildings and a Surveyor, mining with first shipment. The patient will receive a drug information handout for each medication shipped and additional FDA Medication Guides as required.       DISEASE/MEDICATION-SPECIFIC INFORMATION        For patients on injectable medications: Patient currently has 1 doses left.  Next injection is scheduled for 09/05.    SPECIALTY MEDICATION ADHERENCE     Medication Adherence    Patient reported X missed doses in the last month: 0  Specialty Medication: DUPIXENT SYRINGE 300 mg/2 mL Syrg injection (dupilumab)  Patient is on additional specialty medications: No                Were doses missed due to medication being on hold? No    Dupixent  300/2 mg/ml: 9 days of medicine on hand       REFERRAL TO PHARMACIST     Referral to the pharmacist: Not needed      [facility]     Shipping address confirmed in Epic.       Delivery Scheduled: Yes, Expected medication delivery date: 9/4.  However, Rx request for refills was sent to the provider as there are none remaining.     Medication will be delivered via UPS to the prescription address in Epic WAM.    Gaspar Cola Shared [facility] Specialty Technician

## 2023-08-21 NOTE — Unmapped (Signed)
Johnny Valdez 's DUPIXENT SYRINGE 300 mg/2 mL Syrg injection (dupilumab) shipment will be delayed as a result of no refills remain on the prescription.      I have spoken with the patient  at [phone]  and communicated the delay. We will call the patient back to reschedule the delivery upon resolution. We have not confirmed the new delivery date.

## 2023-08-24 NOTE — Unmapped (Signed)
Johnny Valdez 's DUPIXENT SYRINGE 300 mg/2 mL Syrg injection (dupilumab) shipment will be canceled as a result of no refills remain on the prescription.      I have spoken with the patient  at [phone]  and communicated the delay. We will not reschedule the medication and have removed this/these medication(s) from the work request.  We have canceled this work request.

## 2023-09-13 MED ORDER — DUPIXENT 300 MG/2 ML SUBCUTANEOUS SYRINGE
SUBCUTANEOUS | 12 refills | 28.00000 days | Status: CN
Start: 2023-09-13 — End: ?

## 2023-09-20 MED ORDER — DUPIXENT 300 MG/2 ML SUBCUTANEOUS SYRINGE
SUBCUTANEOUS | 12 refills | 28 days
Start: 2023-09-20 — End: ?

## 2023-09-20 NOTE — Unmapped (Signed)
 Johnny Valdez has been contacted in regards to their refill of dupixent. At this time, they have declined refill due to  md not authorizing refills . Refill assessment call date has been updated per the patient's request.
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
## 2023-10-16 MED FILL — DUPIXENT 300 MG/2 ML SUBCUTANEOUS SYRINGE: SUBCUTANEOUS | 28 days supply | Qty: 4 | Fill #0
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
## 2023-11-08 MED FILL — DUPIXENT 300 MG/2 ML SUBCUTANEOUS SYRINGE: SUBCUTANEOUS | 28 days supply | Qty: 4 | Fill #1

## 2023-12-13 NOTE — Unmapped (Signed)
[facility] Specialty and Home Delivery Pharmacy Refill Coordination Note    Specialty Medication(s) to be Shipped:   Inflammatory Disorders: Dupixent    Other medication(s) to be shipped: No additional medications requested for fill at this time     Johnny Valdez, DOB: [birth year]  Phone: [phone] (home) [phone] (work)      All above HIPAA information was verified with patient.     Was a Nurse, learning disability used for this call? No    Completed refill call assessment today to schedule patient's medication shipment from the [facility] and Home Delivery Pharmacy  [phone]).  All relevant notes have been reviewed.     Specialty medication(s) and dose(s) confirmed: Regimen is correct and unchanged.   Changes to medications: [name] reports no changes at this time.  Changes to insurance: No  New side effects reported not previously addressed with a pharmacist or physician: None reported  Questions for the pharmacist: No    Confirmed patient received a Conservation officer, historic buildings and a Surveyor, mining with first shipment. The patient will receive a drug information handout for each medication shipped and additional FDA Medication Guides as required.       DISEASE/MEDICATION-SPECIFIC INFORMATION        For patients on injectable medications: Patient currently has 0 doses left.  Next injection is scheduled for 12/19/2022.    SPECIALTY MEDICATION ADHERENCE     Medication Adherence    Patient reported X missed doses in the last month: 0  Specialty Medication: DUPIXENT SYRINGE 300 mg/2 mL Syrg injection (dupilumab)  Patient is on additional specialty medications: No              Were doses missed due to medication being on hold? No    Dupixent 300/2 mg/ml: 0 days of medicine on hand     REFERRAL TO PHARMACIST     Referral to the pharmacist: Not needed      [facility]     Shipping address confirmed in Epic.       Delivery Scheduled: Yes, Expected medication delivery date: 12/18/2023.     Medication will be delivered via UPS to the prescription address in Epic WAM.    Quintella Reichert   [facility] Special[facility] and Home Delivery Pharmacy  Specialty Technician

## 2023-12-17 MED FILL — DUPIXENT 300 MG/2 ML SUBCUTANEOUS SYRINGE: SUBCUTANEOUS | 28 days supply | Qty: 4 | Fill #2

## 2024-01-14 NOTE — Unmapped (Signed)
[facility] Specialty and Home Delivery Pharmacy Refill Coordination Note    Specialty Medication(s) to be Shipped:   Inflammatory Disorders: Dupixent    Other medication(s) to be shipped: No additional medications requested for fill at this time     Johnny Valdez, DOB: 1991-08-05  Phone: [phone] (home) [phone] (work)      All above HIPAA information was verified with patient's family member, spouse.     Was a Nurse, learning disability used for this call? No    Completed refill call assessment today to schedule patient's medication shipment from the [facility] and Home Delivery Pharmacy  [phone]).  All relevant notes have been reviewed.     Specialty medication(s) and dose(s) confirmed: Regimen is correct and unchanged.   Changes to medications: Johnny Valdez reports no changes at this time.  Changes to insurance: No  New side effects reported not previously addressed with a pharmacist or physician: None reported  Questions for the pharmacist: No    Confirmed patient received a Conservation officer, historic buildings and a Surveyor, mining with first shipment. The patient will receive a drug information handout for each medication shipped and additional FDA Medication Guides as required.       DISEASE/MEDICATION-SPECIFIC INFORMATION        For patients on injectable medications: Patient currently has 1 doses left.  Next injection is scheduled for 1/30.    SPECIALTY MEDICATION ADHERENCE     Medication Adherence    Patient reported X missed doses in the last month: 0  Specialty Medication: Dupixent 300 mg/3mL Q14d  Patient is on additional specialty medications: No  Patient is on more than two specialty medications: No  Any gaps in refill history greater than 2 weeks in the last 3 months: no  Demonstrates understanding of importance of adherence: yes  Informant: spouse          Were doses missed due to medication being on hold? No    Dupixent 300  mg/20mL : 0 doses of medicine on hand     REFERRAL TO PHARMACIST     Referral to the pharmacist: Not needed      [facility]     Shipping address confirmed in Epic.       Delivery Scheduled: Yes, Expected medication delivery date: 01/17/24.     Medication will be delivered via UPS to the prescription address in Epic WAM.    Oliva Bustard, PharmD   [facility] Specialty and Home Delivery Pharmacy  Specialty Pharmacist

## 2024-01-16 MED FILL — DUPIXENT 300 MG/2 ML SUBCUTANEOUS SYRINGE: SUBCUTANEOUS | 28 days supply | Qty: 4 | Fill #3

## 2024-02-06 NOTE — Unmapped (Signed)
 [place] [facility] Delivery Pharm[facility] Clinical Assessment & Refill Coordination Note    Patient reports doing well on the medication and did not have any questions or concerns at this time.     Johnny Valdez, DOB: 1991/01/27  Phone: [phone] (home) [phone] (work)    All above HIPAA information was verified with patient's family member, Wife.     Was a Nurse, learning disability used for this call? No    Specialty Medication(s):   Inflammatory Disorders: Dupixent     Current Outpatient Medications   Medication Sig Dispense Refill    dupilumab (DUPIXENT SYRINGE) 300 mg/2 mL Syrg injection Inject 2 mL (300 mg total) under the skin every fourteen (14) days. 4 mL 12    empty container Misc Use as directed to dispose of Dupixent syringes. 1 each 2     No current facility-administered medications for this visit.        Changes to medications: Chuck reports no changes at this time.    Medication list has been reviewed and updated in Epic: Yes    Allergies   Allergen Reactions    Penicillins        Changes to allergies: No    Allergies have been reviewed and updated in Epic: Yes    SPECIALTY MEDICATION ADHERENCE     Dupixent 300 mg/ml: 1 doses of medicine on hand       Medication Adherence    Patient reported X missed doses in the last month: 0  Specialty Medication: dupilumab (DUPIXENT SYRINGE) 300 mg/2 mL Syrg injection  Patient is on additional specialty medications: No  Patient is on more than two specialty medications: No  Informant: spouse          Specialty medication(s) dose(s) confirmed: Regimen is correct and unchanged.     Are there any concerns with adherence? No    Adherence counseling provided? Not needed    CLINICAL MANAGEMENT AND INTERVENTION      Clinical Benefit Assessment:    Do you feel the medicine is effective or helping your condition? Yes    Clinical Benefit counseling provided? Not needed    Adverse Effects Assessment:    Are you experiencing any side effects? No    Are you experiencing difficulty administering your medicine? No    Quality of Life Assessment:    Quality of Life    Rheumatology  Oncology  Dermatology  1. What impact has your specialty medication had on the symptoms of your skin condition (i.e. itchiness, soreness, stinging)?: Tremendous  2. What impact has your specialty medication had on your comfort level with your skin?: Tremendous  Cystic Fibrosis          How many days over the past month did your AD  keep you from your normal activities? For example, brushing your teeth or getting up in the morning. 0    Have you discussed this with your provider? Not needed    Acute Infection Status:    Acute infections noted within Epic:  No active infections  Patient reported infection: None    Therapy Appropriateness:    Is therapy appropriate based on current medication list, adverse reactions, adherence, clinical benefit and progress toward achieving therapeutic goals? Yes, therapy is appropriate and should be continued     DISEASE/MEDICATION-SPECIFIC INFORMATION      For patients on injectable medications: Patient currently has 1 doses left.  Next injection is scheduled for 02/07/24.    Chronic Inflammatory Diseases: Have you experienced any  flares in the last month? No    PATIENT SPECIFIC NEEDS     Does the patient have any physical, cognitive, or cultural barriers? No    Is the patient high risk? No    Did the patient require a clinical intervention? No    Does the patient require physician intervention or other additional services (i.e., nutrition, smoking cessation, social work)? No    Does the patient have an additional or emergency contact listed in their chart? Yes    SOCIAL DETERMINANTS OF HEALTH     At the [facility], we have learned that life circumstances - like trouble affording food, housing, utilities, or transportation can affect the health of many of our patients.   That is why we wanted to ask: are you currently experiencing any life circumstances that are negatively impacting your health and/or quality of life? Patient declined to answer    Social Drivers of Health     Food Insecurity: Low Risk  (09/14/2023)    Received from [facility]    Hunger Vital Sign     Worried About Running Out of Food in the Last Year: Never true     Ran Out of Food in the Last Year: Never true   Internet Connectivity: Not on file   Housing/Utilities: Not on file   Tobacco Use: High Risk (09/14/2023)    Received from [facility]    Patient History     Smoking Tobacco Use: Every Day     Smokeless Tobacco Use: Unknown     Passive Exposure: Not on file   Transportation Needs: No Transportation Needs (09/18/2023)    Received from [facility]    PRAPARE - Transportation     Lack of Transportation (Medical): No     Lack of Transportation (Non-Medical): No   Alcohol Use: Not At Risk (05/14/2020)    Received from [facility]    AUDIT-C     Frequency of Alcohol Consumption: Monthly or less     Average Number of Drinks: 1 or 2     Frequency of Binge Drinking: Never   Interpersonal Safety: Not on file   Physical Activity: Inactive (05/14/2020)    Received from [facility]    Exercise Vital Sign     Days of Exercise per Week: 0 days     Minutes of Exercise per Session: 0 min   Intimate Partner Violence: Not At Risk (12/01/2022)    Received from [facility]    HITS     Over the last 12 months how often did your partner physically hurt you?: 1     Over the last 12 months how often did your partner insult you or talk down to you?: 1     Over the last 12 months how often did your partner threaten you with physical harm?: 1     Over the last 12 months how often did your partner scream or curse at you?: 1   Stress: No Stress Concern Present (05/14/2020)    Received from [facility] - Occupational Stress Questionnaire     Feeling of Stress : Not at all   Substance Use: Not on file (02/06/2024)   Social Connections: Moderately Integrated (12/01/2022)    Received from [facility]    Social Network     How would you rate your social network (family, work, friends)?: Adequate participation with social networks   Financial Resource Strain: Low Risk  (  04/25/2023)    Received from [facility]    Overall Financial Resource Strain (CARDIA)     Difficulty of Paying Living Expenses: Not hard at all   Depression: Not at risk (09/14/2023)    Received from [facility]    PHQ-2     Patient Health Questionnaire-2 Score: 0   Health Literacy: Not on file       Would you be willing to receive help with any of the needs that you have identified today? Not applicable       SHIPPING     Specialty Medication(s) to be Shipped:   Inflammatory Disorders: Dupixent    Other medication(s) to be shipped: No additional medications requested for fill at this time     Changes to insurance: No    Delivery Scheduled: Yes, Expected medication delivery date: 02/13/24.     Medication will be delivered via UPS to the confirmed prescription address in [facility].    The patient will receive a drug information handout for each medication shipped and additional FDA Medication Guides as required.  Verified that patient has previously received a Conservation officer, historic buildings and a Surveyor, mining.    The patient or caregiver noted above participated in the development of this care plan and knows that they can request review of or adjustments to the care plan at any time.      All of the patient's questions and concerns have been addressed.    Sherral Hammers, PharmD   [facility] Specialty and Home Delivery Pharmacy Specialty Pharmacist

## 2024-02-12 MED FILL — DUPIXENT 300 MG/2 ML SUBCUTANEOUS SYRINGE: SUBCUTANEOUS | 28 days supply | Qty: 4 | Fill #4

## 2024-03-10 NOTE — Unmapped (Signed)
 [facility] Specialty and Home Delivery Pharmacy Refill Coordination Note    Specialty Medication(s) to be Shipped:   Inflammatory Disorders: Dupixent    Other medication(s) to be shipped: No additional medications requested for fill at this time     Johnny Valdez, DOB: Apr 30, 1991  Phone: [phone] (home) [phone] (work)      All above HIPAA information was verified with patient's family member, Wife.     Was a Nurse, learning disability used for this call? No    Completed refill call assessment today to schedule patient's medication shipment from the [facility] and Home Delivery Pharmacy  [phone]).  All relevant notes have been reviewed.     Specialty medication(s) and dose(s) confirmed: Regimen is correct and unchanged.   Changes to medications: Sheppard reports no changes at this time.  Changes to insurance: No  New side effects reported not previously addressed with a pharmacist or physician: None reported  Questions for the pharmacist: No    Confirmed patient received a Conservation officer, historic buildings and a Surveyor, mining with first shipment. The patient will receive a drug information handout for each medication shipped and additional FDA Medication Guides as required.       DISEASE/MEDICATION-SPECIFIC INFORMATION        For patients on injectable medications: Patient currently has 1 doses left.  Next injection is scheduled for 04/03.    SPECIALTY MEDICATION ADHERENCE     Medication Adherence    Patient reported X missed doses in the last month: 0  Specialty Medication: DUPIXENT SYRINGE 300 mg/2 mL Syrg injection (dupilumab)  Patient is on additional specialty medications: No              Were doses missed due to medication being on hold? No    DUPIXENT SYRINGE 300 mg/2 mL Syrg injection (dupilumab): 1 doses of medicine on hand       REFERRAL TO PHARMACIST     Referral to the pharmacist: Not needed      [facility]     Shipping address confirmed in Epic.     Cost and Payment: Patient has a $0 copay, payment information is not required.    Delivery Scheduled: Yes, Expected medication delivery date: 03/18/24.     Medication will be delivered via UPS to the prescription address in Epic WAM.    Dan Europe   [facility] Specialty and Home Delivery Pharmacy  Specialty Technician

## 2024-03-17 MED FILL — DUPIXENT 300 MG/2 ML SUBCUTANEOUS SYRINGE: SUBCUTANEOUS | 28 days supply | Qty: 4 | Fill #5

## 2024-04-10 NOTE — Unmapped (Signed)
 [facility] Specialty and Home Delivery Pharmacy Refill Coordination Note    Specialty Medication(s) to be Shipped:   Inflammatory Disorders: Dupixent     Other medication(s) to be shipped: No additional medications requested for fill at this time     Johnny Valdez, DOB: 03/16/1991  Phone: [phone] (home) [phone] (work)      All above HIPAA information was verified with patient's family member, Spouse.     Was a Nurse, learning disability used for this call? No    Completed refill call assessment today to schedule patient's medication shipment from the [facility] and Home Delivery Pharmacy  [phone]).  All relevant notes have been reviewed.     Specialty medication(s) and dose(s) confirmed: Regimen is correct and unchanged.   Changes to medications: Raife reports no changes at this time.  Changes to insurance: No  New side effects reported not previously addressed with a pharmacist or physician: None reported  Questions for the pharmacist: No    Confirmed patient received a Conservation officer, historic buildings and a Surveyor, mining with first shipment. The patient will receive a drug information handout for each medication shipped and additional FDA Medication Guides as required.       DISEASE/MEDICATION-SPECIFIC INFORMATION        For patients on injectable medications: Patient currently has 0 doses left.  Next injection is scheduled for 5/1.    SPECIALTY MEDICATION ADHERENCE     Medication Adherence    Patient reported X missed doses in the last month: 0  Specialty Medication: DUPIXENT  SYRINGE 300 mg/2 mL  Patient is on additional specialty medications: No              Were doses missed due to medication being on hold? No    Dupixent  300/2 mg/ml: 0 doses of medicine on hand        REFERRAL TO PHARMACIST     Referral to the pharmacist: Not needed      [facility]     Shipping address confirmed in Epic.     Cost and Payment: Patient has a $0 copay, payment information is not required.    Delivery Scheduled: Yes, Expected medication delivery date: 04/16/24.     Medication will be delivered via UPS to the prescription address in Epic WAM.    Canary Ceo   [facility] Specialty and Home Delivery Pharmacy  Specialty Technician

## 2024-04-15 MED FILL — DUPIXENT 300 MG/2 ML SUBCUTANEOUS SYRINGE: SUBCUTANEOUS | 28 days supply | Qty: 4 | Fill #6

## 2024-05-23 NOTE — Unmapped (Signed)
 [facility] Refill Coordination Note    Specialty Medication(s) to be Shipped:   Inflammatory Disorders: Dupixent     Other medication(s) to be shipped: No additional medications requested for fill at this time     John[facility] Valdez, DOB: 1991/08/26  Phone: [phone] (home) [phone] (work)      All above HIPAA information was verified with patient's family member, Wife.     Was a Nurse, learning disability used for this call? No    Completed refill call assessment today to schedule patient's medication shipment from the [facility]  [phone]).  All relevant notes have been reviewed.     Specialty medication(s) and dose(s) confirmed: Regimen is correct and unchanged.   Changes to medications: [name] reports no changes at this time.  Changes to insurance: No  New side effects reported not previously addressed with a pharmacist or physician: None reported  Questions for the pharmacist: No    Confirmed patient received a Conservation officer, historic buildings and a Surveyor, mining with first shipment. The patient will receive a drug information handout for each medication shipped and additional FDA Medication Guides as required.       DISEASE/MEDICATION-SPECIFIC INFORMATION        For patients on injectable medications: Patient currently has 1 doses left.  Next injection is scheduled for 05/29/2024.    SPECIALTY MEDICATION ADHERENCE     Medication Adherence    Patient reported X missed doses in the last month: 0  Specialty Medication: DUPIXENT  SYRINGE 300 mg/2 mL syringe (dupilumab )  Patient is on additional specialty medications: No              Were doses missed due to medication being on hold? No     DUPIXENT  SYRINGE 300 mg/2 mL syringe (dupilumab ): 1 doses of medicine on hand       REFERRAL TO PHARMACIST     Referral to the pharmacist: Not needed      [facility]     Shipping address confirmed in Epic.     Cost and Payment: Patient has a $0 copay, payment information is not required.    Delivery Scheduled: Yes, Expected medication delivery date: 06/03/2024.     Medication will be delivered via UPS to the prescription address in Epic WAM.    [facility]   [facility]  Specialty Technician

## 2024-06-02 MED FILL — DUPIXENT 300 MG/2 ML SUBCUTANEOUS SYRINGE: SUBCUTANEOUS | 28 days supply | Qty: 4 | Fill #7

## 2024-06-26 NOTE — Unmapped (Signed)
 [facility] Refill Coordination Note    Specialty Medication(s) to be Shipped:   Inflammatory Disorders: Dupixent     Other medication(s) to be shipped: No additional medications requested for fill at this time     Johnny Valdez, DOB: [birth year]  Phone: [phone] (home) [phone] (work)      All above HIPAA information was verified with patient's family member, spouse.     Was a Nurse, learning disability used for this call? No    Completed refill call assessment today to schedule patient's medication shipment from the [facility]  [phone]).  All relevant notes have been reviewed.     Specialty medication(s) and dose(s) confirmed: Regimen is correct and unchanged.   Changes to medications: [facility]dez reports no changes at this time.  Changes to insurance: No  New side effects reported not previously addressed with a pharmacist or physician: None reported  Questions for the pharmacist: No    Confirmed patient received a Conservation officer, historic buildings and a Surveyor, mining with first shipment. The patient will receive a drug information handout for each medication shipped and additional FDA Medication Guides as required.       DISEASE/MEDICATION-SPECIFIC INFORMATION        For patients on injectable medications: Patient currently has 0 doses left.  Next injection is scheduled for 07/24.    SPECIALTY MEDICATION ADHERENCE     Medication Adherence    Patient reported X missed doses in the last month: 0  Specialty Medication: dupilumab : DUPIXENT  SYRINGE 300 mg/2 mL syringe  Patient is on additional specialty medications: No  Patient is on more than two specialty medications: No  Any gaps in refill history greater than 2 weeks in the last 3 months: no  Demonstrates understanding of importance of adherence: yes  Informant: spouse  Reliability of informant: reliable  Provider-estimated medication adherence level: good  Patient is at risk for Non-Adherence: No  Reasons for non-adherence: no problems identified  Confirmed plan for next specialty medication refill: delivery by pharmacy  Refills needed for supportive medications: not needed          Refill Coordination    Has the Patients' Contact Information Changed: No  Is the Shipping Address Different: No         Were doses missed due to medication being on hold? No    Dupixent   300/2 mg/ml: 0 days of medicine on hand       REFERRAL TO PHARMACIST     Referral to the pharmacist: Not needed      [facility]     Shipping address confirmed in Epic.     Cost and Payment: Patient has a $0 copay, payment information is not required.    Delivery Scheduled: Yes, Expected medication delivery date: 07/16.     Medication will be delivered via UPS to the prescription address in Epic WAM.    [facility]  Specialty Technician

## 2024-07-01 MED FILL — DUPIXENT 300 MG/2 ML SUBCUTANEOUS SYRINGE: SUBCUTANEOUS | 28 days supply | Qty: 4 | Fill #8

## 2024-07-24 NOTE — Unmapped (Signed)
 [facility] Refill Coordination Note    Specialty Medication(s) to be Shipped:   Inflammatory Disorders: Dupixent     Other medication(s) to be shipped: No additional medications requested for fill at this time     John[facility], DOB: October 09, 1991  Phone: [phone] (home) [phone] (work)      All above HIPAA information was verified with patient's family member, wife.     Was a Nurse, learning disability used for this call? No    Completed refill call assessment today to schedule patient's medication shipment from the [facility]  [phone]).  All relevant notes have been reviewed.     Specialty medication(s) and dose(s) confirmed: Regimen is correct and unchanged.   Changes to medications: Arin reports starting the following medications: doxycycline  Changes to insurance: No  New side effects reported not previously addressed with a pharmacist or physician: None reported  Questions for the pharmacist: No    Confirmed patient received a Conservation officer, historic buildings and a Surveyor, mining with first shipment. The patient will receive a drug information handout for each medication shipped and additional FDA Medication Guides as required.       DISEASE/MEDICATION-SPECIFIC INFORMATION        For patients on injectable medications: Patient currently has 1 doses left.  Next injection is scheduled for 08/07/24.    SPECIALTY MEDICATION ADHERENCE     Medication Adherence    Patient reported X missed doses in the last month: 0  Specialty Medication: dupilumab : DUPIXENT  SYRINGE 300 mg/2 mL syringe  Patient is on additional specialty medications: No              Were doses missed due to medication being on hold? No     dupilumab : DUPIXENT  SYRINGE 300 mg/2 mL syringe: 1 doses of medicine on hand       REFERRAL TO PHARMACIST     Referral to the pharmacist: Not needed      SHIPPING     Shipping address confirmed in Epic.     Cost and Payment: Patient has a $0 copay, payment information is not required.    Delivery Scheduled: Yes, Expected medication delivery date: 08/01/24.     Medication will be delivered via UPS to the prescription address in Epic WAM.    Dena LOISE Gave   [facility]  Specialty Technician

## 2024-07-31 MED FILL — DUPIXENT 300 MG/2 ML SUBCUTANEOUS SYRINGE: SUBCUTANEOUS | 28 days supply | Qty: 4 | Fill #9

## 2024-09-02 DIAGNOSIS — L209 Atopic dermatitis, unspecified: Principal | ICD-10-CM

## 2024-09-02 NOTE — Unmapped (Signed)
 [facility] Refill Coordination Note    Specialty Medication(s) to be Shipped:   Inflammatory Disorders: Dupixent     Other medication(s) to be shipped: No additional medications requested for fill at this time    Specialty Medications not needed at this time: N/A     Johnny Valdez, DOB: [birth year]  Phone: [phone] (home) [phone] (work)      All above HIPAA information was verified with patient's family member, wife.     Was a Nurse, learning disability used for this call? No    Completed refill call assessment today to schedule patient's medication shipment from the [facility]  [phone]).  All relevant notes have been reviewed.     Specialty medication(s) and dose(s) confirmed: Regimen is correct and unchanged.   Changes to medications: [facility] reports no changes at this time.  Changes to insurance: No  New side effects reported not previously addressed with a pharmacist or physician: None reported  Questions for the pharmacist: No    Confirmed patient received a Conservation officer, historic buildings and a Surveyor, mining with first shipment. The patient will receive a drug information handout for each medication shipped and additional FDA Medication Guides as required.       DISEASE/MEDICATION-SPECIFIC INFORMATION        For patients on injectable medications: Next injection is scheduled for 09/04/2024.    SPECIALTY MEDICATION ADHERENCE     Medication Adherence    Patient reported X missed doses in the last month: 0  Specialty Medication: DUPIXENT  SYRINGE 300 mg/2 mL syringe (dupilumab )  Patient is on additional specialty medications: No              Were doses missed due to medication being on hold? No      DUPIXENT  SYRINGE 300 mg/2 mL syringe (dupilumab ): 1 doses of medicine on hand        REFERRAL TO PHARMACIST     Referral to the pharmacist: Not needed      [facility]     Shipping address confirmed in Epic.     Cost and Payment: Unable to determine copay at this time as the prescription requires a prior authorization/financial assistance. Patient is aware that shipment will be held until copay has been approved and payment information collected, if needed.    Delivery Scheduled: Yes, Expected medication delivery date: 09/11/2024.     Medication will be delivered via UPS to the prescription address in Epic WAM.     [facility]   [facility]  Specialty Technician

## 2024-09-10 NOTE — Unmapped (Signed)
 [facility] 's DUPIXENT  SYRINGE 300 mg/2 mL syringe (dupilumab ) shipment will be delayed as a result of prior authorization being required by the patient's insurance.     I have reached out to the patient  at [phone] and communicated the delay. We will call the patient back to reschedule the delivery upon resolution. We have not confirmed the new delivery date.

## 2024-09-22 MED ORDER — DUPIXENT 300 MG/2 ML SUBCUTANEOUS SYRINGE
SUBCUTANEOUS | 12 refills | 28.00000 days | Status: CN
Start: 2024-09-22 — End: ?

## 2024-10-02 NOTE — Unmapped (Signed)
 [facility] 's DUPIXENT  SYRINGE 300 mg/2 mL syringe (dupilumab ) shipment will be canceled as a result of no response for refills after 3 attempts to provider.     I have reached out to the patient  at [phone] and left a voicemail message.  We will not reschedule the medication and have removed this/these medication(s) from the work request.  We have canceled this work request.

## 2024-10-06 NOTE — Unmapped (Signed)
 Specialty Medication(s): Dupixent     Mr.Licausi has been dis-enrolled from the [facility] specialty pharmacy services as a result of no longer taking medication (no further information was provided).    Additional information provided to the patient: Patient can contact [facility] at anytime if they wish to re enroll in pharmacy services      Burnard DELENA Neighbors, PharmD  [facility] Specialty Pharmacist

## 2024-10-06 NOTE — Unmapped (Signed)
 [facility] has been contacted in regards to their refill of Dupixent . At this time, they have declined refill due to patient waiting on therapy change,.. Refill assessment call date has been updated per the patient's request.
